# Patient Record
Sex: Female | Born: 1935 | Race: White | Hispanic: No | Marital: Married | Smoking: Never smoker
Health system: Southern US, Community
[De-identification: ages and names within clinical notes are randomized; demographics above are authoritative.]

## PROBLEM LIST (undated history)

## (undated) DIAGNOSIS — H9190 Unspecified hearing loss, unspecified ear: Secondary | ICD-10-CM

## (undated) DIAGNOSIS — F419 Anxiety disorder, unspecified: Secondary | ICD-10-CM

## (undated) DIAGNOSIS — IMO0001 Reserved for inherently not codable concepts without codable children: Secondary | ICD-10-CM

## (undated) DIAGNOSIS — Z7901 Long term (current) use of anticoagulants: Secondary | ICD-10-CM

## (undated) HISTORY — PX: APPENDECTOMY: SHX54

---

## 2008-05-08 ENCOUNTER — Ambulatory Visit: Payer: Self-pay | Admitting: Cardiology

## 2015-06-02 ENCOUNTER — Emergency Department (HOSPITAL_COMMUNITY): Payer: Medicare Other

## 2015-06-02 ENCOUNTER — Emergency Department (HOSPITAL_COMMUNITY)
Admission: EM | Admit: 2015-06-02 | Discharge: 2015-06-02 | Disposition: A | Discharge status: Home / Self Care | Payer: Medicare Other | Attending: Emergency Medicine | Admitting: Emergency Medicine

## 2015-06-02 ENCOUNTER — Encounter (HOSPITAL_COMMUNITY): Payer: Self-pay

## 2015-06-02 DIAGNOSIS — F22 Delusional disorders: Secondary | ICD-10-CM | POA: Diagnosis not present

## 2015-06-02 DIAGNOSIS — Z046 Encounter for general psychiatric examination, requested by authority: Secondary | ICD-10-CM | POA: Diagnosis present

## 2015-06-02 DIAGNOSIS — F419 Anxiety disorder, unspecified: Secondary | ICD-10-CM | POA: Insufficient documentation

## 2015-06-02 DIAGNOSIS — Z79899 Other long term (current) drug therapy: Secondary | ICD-10-CM | POA: Diagnosis not present

## 2015-06-02 DIAGNOSIS — H9190 Unspecified hearing loss, unspecified ear: Secondary | ICD-10-CM | POA: Diagnosis not present

## 2015-06-02 DIAGNOSIS — F0391 Unspecified dementia with behavioral disturbance: Secondary | ICD-10-CM

## 2015-06-02 DIAGNOSIS — Z7901 Long term (current) use of anticoagulants: Secondary | ICD-10-CM | POA: Diagnosis not present

## 2015-06-02 HISTORY — DX: Unspecified hearing loss, unspecified ear: H91.90

## 2015-06-02 HISTORY — DX: Anxiety disorder, unspecified: F41.9

## 2015-06-02 HISTORY — DX: Long term (current) use of anticoagulants: Z79.01

## 2015-06-02 HISTORY — DX: Reserved for inherently not codable concepts without codable children: IMO0001

## 2015-06-02 LAB — COMPREHENSIVE METABOLIC PANEL
ALBUMIN: 4.4 g/dL (ref 3.5–5.0)
ALT: 13 U/L — AB (ref 14–54)
AST: 18 U/L (ref 15–41)
Alkaline Phosphatase: 60 U/L (ref 38–126)
Anion gap: 10 (ref 5–15)
BILIRUBIN TOTAL: 0.8 mg/dL (ref 0.3–1.2)
BUN: 32 mg/dL — AB (ref 6–20)
CHLORIDE: 104 mmol/L (ref 101–111)
CO2: 26 mmol/L (ref 22–32)
CREATININE: 0.98 mg/dL (ref 0.44–1.00)
Calcium: 9.4 mg/dL (ref 8.9–10.3)
GFR calc Af Amer: >60 mL/min (ref >60)
GFR, EST NON AFRICAN AMERICAN: 52 mL/min — AB (ref >60)
GLUCOSE: 105 mg/dL — AB (ref 65–99)
POTASSIUM: 4.2 mmol/L (ref 3.5–5.1)
Sodium: 140 mmol/L (ref 135–145)
TOTAL PROTEIN: 7.2 g/dL (ref 6.5–8.1)

## 2015-06-02 LAB — CBC WITH DIFFERENTIAL/PLATELET
Basophils Absolute: 0.1 10*3/uL (ref 0.0–0.1)
Basophils Relative: 1 % (ref 0–1)
Eosinophils Absolute: 0.6 10*3/uL (ref 0.0–0.7)
Eosinophils Relative: 6 % — ABNORMAL HIGH (ref 0–5)
HCT: 40.5 % (ref 36.0–46.0)
Hemoglobin: 13.1 g/dL (ref 12.0–15.0)
Lymphocytes Relative: 28 % (ref 12–46)
Lymphs Abs: 2.7 10*3/uL (ref 0.7–4.0)
MCH: 31.2 pg (ref 26.0–34.0)
MCHC: 32.3 g/dL (ref 30.0–36.0)
MCV: 96.4 fL (ref 78.0–100.0)
Monocytes Absolute: 0.8 10*3/uL (ref 0.1–1.0)
Monocytes Relative: 9 % (ref 3–12)
Neutro Abs: 5.3 10*3/uL (ref 1.7–7.7)
Neutrophils Relative %: 56 % (ref 43–77)
Platelets: 227 10*3/uL (ref 150–400)
RBC: 4.2 MIL/uL (ref 3.87–5.11)
RDW: 12.9 % (ref 11.5–15.5)
WBC: 9.4 10*3/uL (ref 4.0–10.5)

## 2015-06-02 LAB — PROTIME-INR
INR: 1.17 (ref 0.00–1.49)
Prothrombin Time: 15.1 seconds (ref 11.6–15.2)

## 2015-06-02 LAB — ETHANOL: Alcohol, Ethyl (B): <5 mg/dL (ref <5)

## 2015-06-02 NOTE — ED Notes (Signed)
TTS consult complete  

## 2015-06-02 NOTE — BH Assessment (Signed)
Reviewed ED notes prior to initiating assessment. Pt was brought under IVC, petitioned by DSS due to concerns that pt's mental health has been untreated, and has resulted in her neglecting her physical health. DSS has an open case but pt refuses to open the door.    Requested cart be placed with pt for assessment and IVC papers be faxed to [phone].    Assessment to commence shortly.     Clista Bernhardt, [facility] Triage Specialist 06/02/2015 7:42 PM

## 2015-06-02 NOTE — ED Notes (Signed)
MD at bedside. 

## 2015-06-02 NOTE — ED Notes (Addendum)
Patients son at bedside talking with patient. Patient appears tearful at this time.

## 2015-06-02 NOTE — BH Assessment (Addendum)
Pt is unable to hear this writer on monitor and would be unable to hear on the phone as well. Collaborated with RN. Faxed copy of assessment sheet. RN will call this writer at [phone] when she has received assessment. Will attempt assessment with aid of RN.   Pt will need to verbalize consent to family remaining in room or they will need to step out.   Assessment to commence shortly.   Received call from Angelina at [facility] ED stating pt was being sent to CT. Requests RN call back when ready to begin assessment.   Clista Bernhardt, [facility] Triage Specialist 06/02/2015 8:00 PM

## 2015-06-02 NOTE — ED Notes (Signed)
Patient undress and belongings placed in locker with lock in place.

## 2015-06-02 NOTE — BH Assessment (Addendum)
Tele Assessment Note Pt is very hard of hearing, assessment was assisted by RN who would write questions to pt and speak directly in pt's ear. Collateral information was provided by pt's son, with pt's verbal consent.   Jeanne Ayers is an 81 y.o. female. BIB sheriff under IVC. Son had become concerned that pt's dementia and associated delusions were progressing, that she was not paying some bills that needed to be paid, and being very anxious about paying bills that did not need to be paid. He also reports concerns that pt is not maintaining appropriate hygiene around food preparation. At the time of assessment pt was calm and oriented times three, but did not understand why she had been brought to the hospital. Pt lives alone, uses no assistive devices, and does all of her ADLs without assistance. Pt denies SI, HI, AVH, self harm and SA. Pt reports her sons live near by and help her due errands. She states she likes to live alone and doing work around the house.   Pt denies sx of depression, mania or hypomania. She reports at times she gets upset at people, and family reports pt has panic attacks at times. Pt denies hx of abuse or neglect. Pt reports sometimes worry makes it hard for her to sleep if she is ruminating on something. Pt reports she puts a chair under the door and keeps it locked. She mentioned multiple times that she has been told people may try to kill her downtown. She reports she does have a gun in the home but denies that it works.   Family hx is negative for MH, SA or SI concerns.   Son reports he reached out to DSS with concerns at the advice of attorney. Son felt he may need to get POA, but attorney advised guardianship would be more appropriate. Per son attorney suggested pt contact DSS so he would not have to directly confront his mother about the guardianship. During assessment pt was effusive in her praise for her sons and how they help her.   Per IVC:   When asked directly  son reported he did not think pt was a danger to herself or others, and that she is still capable of living indpendently with some assistance. Pt has been treated by family doctor, and as far as son knows, no referral has been made to neurologist or psychiatrist.   Axis I: Dementia Per History  300.00 Unspecified Anxiety Disorder, rule out Anxiety related to another medical condition   Past Medical History:  Past Medical History  Diagnosis Date  . Anxiety   . Hearing impaired   . Chronic anticoagulation     Past Surgical History  Procedure Laterality Date  . Appendectomy      Family History: No family history on file.  Social History:  reports that she has never smoked. She does not have any smokeless tobacco history on file. She reports that she does not drink alcohol or use illicit drugs.  Additional Social History:  Alcohol / Drug Use Pain Medications: See PTA Prescriptions: See PTA, son reports pt takes half of prescribed medication for her heart issue Over the Counter: See PTA History of alcohol / drug use?: No history of alcohol / drug abuse Longest period of sobriety (when/how long): NA Negative Consequences of Use:  (NA) Withdrawal Symptoms:  (NA)  CIWA: CIWA-Ar BP: 165/74 mmHg Pulse Rate: 87 COWS:    PATIENT STRENGTHS: (choose at least two) Ability for insight Average or  above average intelligence Capable of independent living Communication skills  Allergies:  Allergies  Allergen Reactions  . Other     Pt unsure    Home Medications:  (Not in a hospital admission)  OB/GYN Status:  No LMP recorded.  General Assessment Data Location of Assessment: [facility] TTS Assessment: In system Is this a Tele or Face-to-Face Assessment?: Tele Assessment Is this an Initial Assessment or a Re-assessment for this encounter?: Initial Assessment Marital status: Widowed Is patient pregnant?: No Pregnancy Status: No Living Arrangements: Alone Can pt return to current  living arrangement?: Yes Admission Status: Voluntary Is patient capable of signing voluntary admission?: Yes Referral Source: Self/Family/Friend Insurance type: none     Crisis Care Plan Living Arrangements: Alone Name of Psychiatrist: none Name of Therapist: none  Education Status Is patient currently in school?: No Current Grade: NA Highest grade of school patient has completed: NA Name of school: NA Contact person: NA  Risk to self with the past 6 months Suicidal Ideation: No Has patient been a risk to self within the past 6 months prior to admission? : No Suicidal Intent: No Has patient had any suicidal intent within the past 6 months prior to admission? : No Is patient at risk for suicide?: No Suicidal Plan?: No Has patient had any suicidal plan within the past 6 months prior to admission? : No Access to Means: No What has been your use of drugs/alcohol within the last 12 months?: none Previous Attempts/Gestures: No How many times?: 0 Other Self Harm Risks: none Triggers for Past Attempts: None known Intentional Self Injurious Behavior: None Family Suicide History: No Recent stressful life event(s):  (none reported ) Persecutory voices/beliefs?: No Depression: No Depression Symptoms:  (irritable at times) Substance abuse history and/or treatment for substance abuse?: No Suicide prevention information given to non-admitted patients: Not applicable  Risk to Others within the past 6 months Homicidal Ideation: No Does patient have any lifetime risk of violence toward others beyond the six months prior to admission? : No Thoughts of Harm to Others: No Current Homicidal Intent: No Current Homicidal Plan: No Access to Homicidal Means: No Identified Victim: none History of harm to others?: No Assessment of Violence: None Noted Violent Behavior Description: none Does patient have access to weapons?: No Criminal Charges Pending?: No Does patient have a court date:  No Is patient on probation?: No  Psychosis Hallucinations: None noted Delusions: None noted  Mental Status Report Appearance/Hygiene: Unremarkable Eye Contact: Good Motor Activity: Unremarkable Speech: Logical/coherent Level of Consciousness: Alert Mood: Pleasant, Euthymic Affect: Appropriate to circumstance Anxiety Level: Moderate Thought Processes: Coherent, Relevant Judgement: Partial Orientation: Person, Place, Time, Situation Obsessive Compulsive Thoughts/Behaviors: None  Cognitive Functioning Concentration: Normal Memory: Remote Intact, Recent Impaired IQ: Average Insight: Fair Impulse Control: Fair Appetite: Good Weight Loss: 0 Weight Gain: 0 Sleep: No Change Total Hours of Sleep: 7 Vegetative Symptoms: None  ADLScreening [facility] Assessment Services) Patient's cognitive ability adequate to safely complete daily activities?: Yes Patient able to express need for assistance with ADLs?: Yes Independently performs ADLs?: Yes (appropriate for developmental age)  Prior Inpatient Therapy Prior Inpatient Therapy: No Prior Therapy Dates: NA Prior Therapy Facilty/Provider(s): NA Reason for Treatment: NA  Prior Outpatient Therapy Prior Outpatient Therapy: No Prior Therapy Dates: nA Prior Therapy Facilty/Provider(s): NA Reason for Treatment: NA Does patient have an ACCT team?: No Does patient have Intensive In-House Services?  : No Does patient have [facility] services? : No Does patient have P4CC services?: No  ADL  Screening (condition at time of admission) Patient's cognitive ability adequate to safely complete daily activities?: Yes Is the patient deaf or have difficulty hearing?: Yes Does the patient have difficulty seeing, even when wearing glasses/contacts?: No Does the patient have difficulty concentrating, remembering, or making decisions?: Yes Patient able to express need for assistance with ADLs?: Yes Does the patient have difficulty dressing or bathing?:  No Independently performs ADLs?: Yes (appropriate for developmental age) Weakness of Legs: None Weakness of Arms/Hands: None  Home Assistive Devices/Equipment Home Assistive Devices/Equipment: None    Abuse/Neglect Assessment (Assessment to be complete while patient is alone) Physical Abuse: Denies Verbal Abuse: Denies Sexual Abuse: Denies Exploitation of patient/patient's resources: Denies Self-Neglect: Denies Values / Beliefs Cultural Requests During Hospitalization: None Spiritual Requests During Hospitalization: None   Advance Directives (For Healthcare) Does patient have an advance directive?: No Would patient like information on creating an advanced directive?: No - patient declined information Nutrition Screen- [facility] Adult/[facility] Patient's home diet: Regular Has the patient recently lost weight without trying?: Yes, 2-13 lbs. (due to working in her yard) Has the patient been eating poorly because of a decreased appetite?: No Malnutrition Screening Tool Score: 1  Additional Information 1:1 In Past 12 Months?: No CIRT Risk: No Elopement Risk: No Does patient have medical clearance?: No     Disposition:  Per Janann August, FNP pt does not meet inpt criteria. Per Brett Albino, FNP a referral for a neurologist should be considered, and follow up with PCP as to why pt feels she only needs half the dose of her medications.   Informed Dr. Adela Lank of recommendations.      Clista Bernhardt, [facility] Triage Specialist 06/02/2015 9:07 PM  Disposition Initial Assessment Completed for this Encounter: Yes  Lanya Bucks M 06/02/2015 9:06 PM

## 2015-06-02 NOTE — ED Notes (Signed)
Patient wanded by security. [employer] at beside with patient.

## 2015-06-02 NOTE — ED Notes (Signed)
Pt brought to ER with [employer] with IVC paperwork.  Reports DSS took out the paperwork.  Papers state that pt has long history of untreated mental illness and she has been neglecting her physical health care due to delusional thoughts.  Reports has not been taking her medications.  Reports pt's windows are lined with aluminum foil, doors blocked by tree branches, and chairs, and she is barricading herself in her house.  Reports adult protective services has an open evaluation but says pt refuses to come to the door when they come.  IVC paperwork outlines more details, see paperwork.

## 2015-06-02 NOTE — Discharge Instructions (Signed)
Dementia °Dementia is a word that is used to describe problems with the brain and how it works. People with dementia have memory loss. They may also have problems with thinking, speaking, or solving problems. It can affect how they act around people, how they do their job, their mood, and their personality. These changes may not show up for a long time. Family or friends may not notice problems in the early part of this disease. °HOME CARE °The following tips are for the person living with, or caring for, the person with dementia. °Make the home safe. °· Remove locks on bathroom doors. °· Use childproof locks on cabinets where alcohol, cleaning supplies, or chemicals are stored. °· Put outlet covers in electrical outlets. °· Put in childproof locks to keep doors and windows safe. °· Remove stove knobs, or put in safety knobs that shut off on their own. °· Lower the temperature on water heaters. °· Label medicines. Lock them in a safe place. °· Keep knives, lighters, matches, power tools, and guns out of reach or in a safe place. °· Remove objects that might break or can hurt the person. °· Make sure lighting is good inside and outside. °· Put in grab bars if needed. °· Use a device that detects falls or other needs for help. °Lessen confusion. °· Keep familiar objects and people around. °· Use night lights or low lit (dim) lights at night. °· Label objects or areas. °· Use reminders, notes, or directions for daily activities or tasks. °· Keep a simple routine that is the same for waking, meals, bathing, dressing, and bedtime. °· Create a calm and quiet home. °· Put up clocks and calendars. °· Keep emergency numbers and the home address near all phones. °· Help show the different times of day. Open the curtains during the day to let light in. °Speak clearly and directly. °· Choose simple words and short sentences. °· Use a gentle, calm voice. °· Do not interrupt. °· If the person has a hard time finding a word to  use, give them the word or thought. °· Ask 1 question at a time. Give enough time for the person to answer. Repeat the question if the person does not answer. °Do things that lessen restlessness. °· Provide a comfortable bed. °· Have the same bedtime routine every night. °· Have a regular walking and activity schedule. °· Lessen naps during the day. °· Do not let the person drink a lot of caffeine. °· Go to events that are not overwhelming. °Eat well and drink fluids. °· Lessen distractions during meal times and snacks. °· Avoid foods that are too hot or too cold. °· Watch how the person chews and swallows. This is to make sure they do not choke. °Other °· Keep all vision, hearing, dental, and medical visits with the doctor. °· Only give medicines as told by the doctor. °· Watch the person's driving ability. Do not let the person drive if he or she cannot drive safely. °· Use a program that helps find a person if they become missing. You may need to register with this program. °GET HELP RIGHT AWAY IF:  °· A fever of 102° F (38.9° C) develops. °· Confusion develops or gets worse. °· Sleepiness develops or gets worse. °· Staying awake is hard to do. °· New behavior problems start like mood swings, aggression, and seeing things that are not there. °· Problems with balance, speech, or falling develop. °· Problems swallowing develop. °· Any   problems of another sickness develop. °MAKE SURE YOU: °· Understand these instructions. °· Will watch his or her condition. °· Will get help right away if he or she is not doing well or gets worse. °Document Released: 09/14/2008 Document Revised: 12/25/2011 Document Reviewed: 02/27/2011 °ExitCare® Patient Information ©2015 [employer]. This information is not intended to replace advice given to you by your health care provider. Make sure you discuss any questions you have with your health care provider. ° °

## 2015-06-02 NOTE — ED Provider Notes (Signed)
CSN: [id number]     Arrival date & time 06/02/15  1834 History   First MD Initiated Contact with Patient 06/02/15 [phone]     Chief Complaint  Patient presents with  . V70.1     (Consider location/radiation/quality/duration/timing/severity/associated sxs/prior Treatment) Patient is a 80 y.o. female presenting with mental health disorder. The history is provided by the patient, the EMS personnel and a significant other.  Mental Health Problem Presenting symptoms: agitation, delusional and paranoid behavior   Patient accompanied by:  Law enforcement Degree of incapacity (severity):  Severe Onset quality:  Gradual Duration:  12 months Timing:  Constant Progression:  Worsening Chronicity:  Chronic Context: noncompliance   Treatment compliance:  Untreated Time since last psychoactive medication taken:  2 days Relieved by:  Nothing Worsened by:  Nothing tried Ineffective treatments:  None tried Associated symptoms: no chest pain and no headaches   Risk factors: neurological disease (dementia)   Risk factors: no hx of mental illness    81 yo F with a chief complaint of delusions and dementia. Per the son the patient has been staying at home and not interacting with other people. Has been barricading the windows and doors with tree branches chairs and aluminum foil. After discussion with her family doctor the decision was made to involuntarily commit her. On arrival patient denies any such complaints. States that she just wants home. History difficult secondary to hearing loss.  Past Medical History  Diagnosis Date  . Anxiety   . Hearing impaired   . Chronic anticoagulation    Past Surgical History  Procedure Laterality Date  . Appendectomy     No family history on file. Social History  Substance Use Topics  . Smoking status: Never Smoker   . Smokeless tobacco: None  . Alcohol Use: No   OB History    No data available     Review of Systems  Constitutional: Negative for  fever and chills.  HENT: Negative for congestion and rhinorrhea.   Eyes: Negative for redness and visual disturbance.  Respiratory: Negative for shortness of breath and wheezing.   Cardiovascular: Negative for chest pain and palpitations.  Gastrointestinal: Negative for nausea and vomiting.  Genitourinary: Negative for dysuria and urgency.  Musculoskeletal: Negative for myalgias and arthralgias.  Skin: Negative for pallor and wound.  Neurological: Negative for dizziness and headaches.  Psychiatric/Behavioral: Positive for behavioral problems, paranoia and agitation.      Allergies  Other  Home Medications   Prior to Admission medications   Medication Sig Start Date End Date Taking? Authorizing Provider  atenolol (TENORMIN) 50 MG tablet Take 50 mg by mouth every morning. 05/28/15   Historical Provider, MD  LORazepam (ATIVAN) 0.5 MG tablet Take 1 tablet by mouth 2 (two) times daily. 05/24/15   Historical Provider, MD   BP 144/56 mmHg  Pulse 80  Temp(Src) 97.9 F (36.6 C) (Oral)  Resp 20  Ht 5' 3.5" (1.613 m)  Wt 106 lb 6.4 oz (48.263 kg)  BMI 18.55 kg/m2  SpO2 99% Physical Exam  Constitutional: She is oriented to person, place, and time. She appears well-developed and well-nourished. No distress.  HENT:  Head: Normocephalic and atraumatic.  Eyes: EOM are normal. Pupils are equal, round, and reactive to light.  Neck: Normal range of motion. Neck supple.  Cardiovascular: Normal rate and regular rhythm.  Exam reveals no gallop and no friction rub.   No murmur heard. Pulmonary/Chest: Effort normal. She has no wheezes. She has no rales.  Abdominal:  Soft. She exhibits no distension. There is no tenderness. There is no rebound and no guarding.  Musculoskeletal: She exhibits no edema or tenderness.  Neurological: She is alert and oriented to person, place, and time.  Skin: Skin is warm and dry. She is not diaphoretic.  Psychiatric: She has a normal mood and affect. Her behavior is  normal. Thought content is delusional.    ED Course  Procedures (including critical care time) Labs Review Labs Reviewed  CBC WITH DIFFERENTIAL/PLATELET - Abnormal; Notable for the following:    Eosinophils Relative 6 (*)    All other components within normal limits  COMPREHENSIVE METABOLIC PANEL - Abnormal; Notable for the following:    Glucose, Bld 105 (*)    BUN 32 (*)    ALT 13 (*)    GFR calc non Af Amer 52 (*)    All other components within normal limits  ETHANOL  PROTIME-INR  URINE RAPID DRUG SCREEN, HOSP PERFORMED  URINALYSIS, ROUTINE W REFLEX MICROSCOPIC (NOT AT [facility])    Imaging Review Ct Head Wo Contrast  06/02/2015   CLINICAL DATA:  Altered mental status.  EXAM: CT HEAD WITHOUT CONTRAST  TECHNIQUE: Contiguous axial images were obtained from the base of the skull through the vertex without intravenous contrast.  COMPARISON:  None.  FINDINGS: There is a 7 mm calcification on the inner table of the right frontal bone which probably represents a small calcified meningioma. This is of no consequence. There is no acute intracranial hemorrhage or acute infarction.  There are multiple areas of periventricular white matter lucency consistent with chronic small vessel ischemic disease. Diffuse cerebral cortical and cerebellar atrophy. No significant ventricular dilatation.  Chronic sinus disease.  Previous bilateral mastoid surgery.  IMPRESSION: No acute intracranial abnormality. 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 Diffuse atrophy.   Electronically Signed   By: Francene Boyers M.D.   On: 06/02/2015 20:39   I have personally reviewed and evaluated these images and lab results as part of my medical decision-making.   EKG Interpretation None      MDM   Final diagnoses:  Delusion  Dementia, with behavioral disturbance    81 yo F with a chief Plane of delusions and worsening dementia. Patient was evaluated medically without any acute  medical issues. CBC CMP EtOH and PT/INR unremarkable. CT head unremarkable. Patient unable to provide urine. Evaluated by TTS, patient was found to not qualify for inpatient criteria. Feel that the patient needs a full neurologic evaluation by a neurologist prior to mental health admission. Discussed with patient and family feel comfortable with discharge home. Will have PCP follow-up tomorrow.  9:48 PM:  I have discussed the diagnosis/risks/treatment options with the patient and family and believe the pt to be eligible for discharge home to follow-up with PCP. We also discussed returning to the ED immediately if new or worsening sx occur. We discussed the sx which are most concerning (e.g., sudden worsening complaints) that necessitate immediate return. Medications administered to the patient during their visit and any new prescriptions provided to the patient are listed below.  Medications given during this visit Medications - No data to display  New Prescriptions   No medications on file     The patient appears reasonably screen and/or stabilized for discharge and I doubt any other medical condition or other [facility] requiring further screening, evaluation, or treatment in the ED at this time prior to discharge.      Melene Plan, DO  06/02/15 2148 

## 2019-07-14 ENCOUNTER — Emergency Department (HOSPITAL_COMMUNITY)
Admission: EM | Admit: 2019-07-14 | Discharge: 2019-07-14 | Disposition: A | Discharge status: Home / Self Care | Payer: Medicare Other | Attending: Emergency Medicine | Admitting: Emergency Medicine

## 2019-07-14 ENCOUNTER — Encounter (HOSPITAL_COMMUNITY): Payer: Self-pay | Admitting: Emergency Medicine

## 2019-07-14 ENCOUNTER — Emergency Department (HOSPITAL_COMMUNITY): Payer: Medicare Other

## 2019-07-14 ENCOUNTER — Other Ambulatory Visit: Payer: Self-pay

## 2019-07-14 DIAGNOSIS — S0181XA Laceration without foreign body of other part of head, initial encounter: Secondary | ICD-10-CM | POA: Insufficient documentation

## 2019-07-14 DIAGNOSIS — W19XXXA Unspecified fall, initial encounter: Secondary | ICD-10-CM | POA: Diagnosis not present

## 2019-07-14 DIAGNOSIS — Z23 Encounter for immunization: Secondary | ICD-10-CM | POA: Diagnosis not present

## 2019-07-14 DIAGNOSIS — M25512 Pain in left shoulder: Secondary | ICD-10-CM | POA: Insufficient documentation

## 2019-07-14 DIAGNOSIS — Y939 Activity, unspecified: Secondary | ICD-10-CM | POA: Insufficient documentation

## 2019-07-14 DIAGNOSIS — Y92129 Unspecified place in nursing home as the place of occurrence of the external cause: Secondary | ICD-10-CM | POA: Diagnosis not present

## 2019-07-14 DIAGNOSIS — Y999 Unspecified external cause status: Secondary | ICD-10-CM | POA: Insufficient documentation

## 2019-07-14 DIAGNOSIS — Z79899 Other long term (current) drug therapy: Secondary | ICD-10-CM | POA: Diagnosis not present

## 2019-07-14 DIAGNOSIS — S0990XA Unspecified injury of head, initial encounter: Secondary | ICD-10-CM | POA: Diagnosis present

## 2019-07-14 DIAGNOSIS — S42202A Unspecified fracture of upper end of left humerus, initial encounter for closed fracture: Secondary | ICD-10-CM

## 2019-07-14 MED ORDER — ACETAMINOPHEN 325 MG PO TABS
650.0000 mg | ORAL_TABLET | Freq: Once | ORAL | Status: AC
  Administered 2019-07-14: 650 mg via ORAL
  Filled 2019-07-14: qty 2

## 2019-07-14 MED ORDER — LIDOCAINE-EPINEPHRINE (PF) 2 %-1:200000 IJ SOLN
10.0000 mL | Freq: Once | INTRAMUSCULAR | Status: AC
  Administered 2019-07-14: 10 mL
  Filled 2019-07-14: qty 10

## 2019-07-14 MED ORDER — TETANUS-DIPHTH-ACELL PERTUSSIS 5-2.5-18.5 LF-MCG/0.5 IM SUSP
0.5000 mL | Freq: Once | INTRAMUSCULAR | Status: AC
  Administered 2019-07-14: 0.5 mL via INTRAMUSCULAR
  Filled 2019-07-14: qty 0.5

## 2019-07-14 MED ORDER — TRAMADOL HCL 50 MG PO TABS
25.0000 mg | ORAL_TABLET | Freq: Once | ORAL | Status: AC
  Administered 2019-07-14: 07:00:00 25 mg via ORAL
  Filled 2019-07-14: qty 1

## 2019-07-14 NOTE — ED Provider Notes (Signed)
[facility] EMERGENCY DEPARTMENT Provider Note   CSN: [id number] Arrival date & time: 07/14/19  0256     History   Chief Complaint Chief Complaint  Patient presents with   Fall    HPI Jeanne Ayers is a 85 y.o. female.     Patient brought to the ER for evaluation after a fall.  She comes to the ER by ambulance from nursing home.  Fall was unwitnessed, patient found on the floor.  She has a laceration on the left side of her forehead and complains of left shoulder pain.     Past Medical History:  Diagnosis Date   Anxiety    Chronic anticoagulation    Hearing impaired     There are no active problems to display for this patient.   Past Surgical History:  Procedure Laterality Date   APPENDECTOMY       OB History   No obstetric history on file.      Home Medications    Prior to Admission medications   Medication Sig Start Date End Date Taking? Authorizing Provider  atenolol (TENORMIN) 50 MG tablet Take 50 mg by mouth every morning. 05/28/15   [provider]  LORazepam (ATIVAN) 0.5 MG tablet Take 1 tablet by mouth 2 (two) times daily. 05/24/15   [provider]    Family History History reviewed. No pertinent family history.  Social History Social History   Tobacco Use   Smoking status: Never Smoker  Substance Use Topics   Alcohol use: No   Drug use: No     Allergies   Other, Penicillins, and Sulfa antibiotics   Review of Systems Review of Systems  Musculoskeletal: Positive for arthralgias.  Skin: Positive for wound.  All other systems reviewed and are negative.    Physical Exam Updated Vital Signs Ht 5\' 5"  (1.651 m)    Wt 54.4 kg    BMI 19.97 kg/m   Physical Exam Vitals signs and nursing note reviewed.  Constitutional:      General: She is not in acute distress.    Appearance: Normal appearance. She is well-developed.  HENT:     Head: Normocephalic. Laceration (left forehead) present.      Right Ear:  Hearing normal.     Left Ear: Hearing normal.     Nose: Nose normal.  Eyes:     Extraocular Movements: Extraocular movements intact.     Conjunctiva/sclera: Conjunctivae normal.     Left eye: No hemorrhage.    Pupils: Pupils are equal, round, and reactive to light.  Neck:     Musculoskeletal: Normal range of motion and neck supple.  Cardiovascular:     Rate and Rhythm: Regular rhythm.     Heart sounds: S1 normal and S2 normal. No murmur. No friction rub. No gallop.   Pulmonary:     Effort: Pulmonary effort is normal. No respiratory distress.     Breath sounds: Normal breath sounds.  Chest:     Chest wall: No tenderness.  Abdominal:     General: Bowel sounds are normal.     Palpations: Abdomen is soft.     Tenderness: There is no abdominal tenderness. There is no guarding or rebound. Negative signs include Murphy's sign and McBurney's sign.     Hernia: No hernia is present.  Musculoskeletal:     Left shoulder: She exhibits decreased range of motion and tenderness.     Right hip: Normal.     Left hip: Normal.  Skin:  General: Skin is warm and dry.     Findings: Ecchymosis and laceration present. No rash.  Neurological:     Mental Status: She is alert and oriented to person, place, and time.     GCS: GCS eye subscore is 4. GCS verbal subscore is 5. GCS motor subscore is 6.     Cranial Nerves: No cranial nerve deficit.     Sensory: No sensory deficit.     Coordination: Coordination normal.  Psychiatric:        Speech: Speech normal.        Behavior: Behavior normal.        Thought Content: Thought content normal.      ED Treatments / Results  Labs (all labs ordered are listed, but only abnormal results are displayed) Labs Reviewed - No data to display  EKG None  Radiology Dg Chest 1 View  Result Date: 07/14/2019 CLINICAL DATA:  Fall EXAM: CHEST  1 VIEW COMPARISON:  None available FINDINGS: Mild streaky density behind the heart, likely atelectasis or scarring. No  edema, effusion, or pneumothorax. Normal heart size allowing for rotation. Known left humeral neck fracture. IMPRESSION: No evidence of cardiopulmonary disease. Electronically Signed   By: Marnee Spring M.D.   On: 07/14/2019 04:06   Dg Pelvis 1-2 Views  Result Date: 07/14/2019 CLINICAL DATA:  Fall EXAM: PELVIS - 1-2 VIEW COMPARISON:  None available FINDINGS: Basicervical left femoral neck fracture with chronic appearance - fracture margins are sclerotic and well-defined. There is mild varus angulation. Possible remote inferior pubic rami fractures. No evidence of acute fracture or diastasis. Osteopenia is advanced. IMPRESSION: Chronic appearing left femoral neck fracture with varus angulation. Electronically Signed   By: Marnee Spring M.D.   On: 07/14/2019 04:08   Ct Head Wo Contrast  Result Date: 07/14/2019 CLINICAL DATA:  Head trauma EXAM: CT HEAD WITHOUT CONTRAST CT CERVICAL SPINE WITHOUT CONTRAST TECHNIQUE: Multidetector CT imaging of the head and cervical spine was performed following the standard protocol without intravenous contrast. Multiplanar CT image reconstructions of the cervical spine were also generated. COMPARISON:  02/12/2016 report FINDINGS: CT HEAD FINDINGS Brain: No evidence of acute infarction, hemorrhage, hydrocephalus, extra-axial collection or mass lesion/mass effect. Patchy low-density in the cerebral white matter from chronic small vessel ischemia, moderate to extensive. Nonspecific atrophy. Small dural-based calcification along the right frontal convexity is stable from 2017 and incidental. Vascular: No hyperdense vessel or unexpected calcification. Skull: Negative for fracture Sinuses/Orbits: Bilateral mastoidectomy with opacification of the right more than left mastoid bowl. Deficient bilateral ossicular chain, worse on the right, likely stable. CT CERVICAL SPINE FINDINGS Alignment: Normal. Skull base and vertebrae: No acute fracture. No primary bone lesion or focal  pathologic process. Soft tissues and spinal canal: No prevertebral fluid or swelling. No visible canal hematoma. Disc levels:  Ordinary degenerative changes Upper chest: Negative IMPRESSION: 1. No evidence of intracranial or cervical spine injury. 2. Chronic findings are described above. Electronically Signed   By: Marnee Spring M.D.   On: 07/14/2019 04:15   Ct Cervical Spine Wo Contrast  Result Date: 07/14/2019 CLINICAL DATA:  Head trauma EXAM: CT HEAD WITHOUT CONTRAST CT CERVICAL SPINE WITHOUT CONTRAST TECHNIQUE: Multidetector CT imaging of the head and cervical spine was performed following the standard protocol without intravenous contrast. Multiplanar CT image reconstructions of the cervical spine were also generated. COMPARISON:  02/12/2016 report FINDINGS: CT HEAD FINDINGS Brain: No evidence of acute infarction, hemorrhage, hydrocephalus, extra-axial collection or mass lesion/mass effect. Patchy low-density in the  cerebral white matter from chronic small vessel ischemia, moderate to extensive. Nonspecific atrophy. Small dural-based calcification along the right frontal convexity is stable from 2017 and incidental. Vascular: No hyperdense vessel or unexpected calcification. Skull: Negative for fracture Sinuses/Orbits: Bilateral mastoidectomy with opacification of the right more than left mastoid bowl. Deficient bilateral ossicular chain, worse on the right, likely stable. CT CERVICAL SPINE FINDINGS Alignment: Normal. Skull base and vertebrae: No acute fracture. No primary bone lesion or focal pathologic process. Soft tissues and spinal canal: No prevertebral fluid or swelling. No visible canal hematoma. Disc levels:  Ordinary degenerative changes Upper chest: Negative IMPRESSION: 1. No evidence of intracranial or cervical spine injury. 2. Chronic findings are described above. Electronically Signed   By: Marnee Spring M.D.   On: 07/14/2019 04:15   Dg Shoulder Left  Result Date: 07/14/2019 CLINICAL  DATA:  Left shoulder pain EXAM: LEFT SHOULDER - 2+ VIEW COMPARISON:  None. FINDINGS: Nondisplaced surgical neck fracture of the humerus with mild impaction medially and posteriorly. No dislocation. Prominent generalized osteopenia. IMPRESSION: Mildly impacted surgical neck fracture of the humerus. Electronically Signed   By: Marnee Spring M.D.   On: 07/14/2019 04:04    Procedures .Marland KitchenLaceration Repair  Date/Time: 07/14/2019 4:43 AM Performed by: Gilda Crease, MD Authorized by: Gilda Crease, MD   Consent:    Consent obtained:  Emergent situation Universal protocol:    Imaging studies available: yes     Required blood products, implants, devices, and special equipment available: yes     Site/side marked: yes     Immediately prior to procedure, a time out was called: yes     Patient identity confirmed:  Hospital-assigned identification number Anesthesia (see MAR for exact dosages):    Anesthesia method:  Local infiltration   Local anesthetic:  Lidocaine 2% WITH epi Laceration details:    Location:  Face   Face location:  Forehead   Length (cm):  2.3 Repair type:    Repair type:  Simple Pre-procedure details:    Preparation:  Patient was prepped and draped in usual sterile fashion and imaging obtained to evaluate for foreign bodies Exploration:    Hemostasis achieved with:  Epinephrine and direct pressure   Contaminated: no   Treatment:    Area cleansed with:  Hibiclens   Irrigation solution:  Sterile saline   Irrigation method:  Syringe Skin repair:    Repair method:  Sutures   Suture size:  5-0   Suture material:  Fast-absorbing gut   Number of sutures:  4 Approximation:    Approximation:  Close Post-procedure details:    Dressing:  Open (no dressing)   Patient tolerance of procedure:  Tolerated well, no immediate complications   (including critical care time)  Medications Ordered in ED Medications  lidocaine-EPINEPHrine (XYLOCAINE W/EPI) 2  %-1:200000 (PF) injection 10 mL (10 mLs Infiltration Given 07/14/19 0320)  Tdap (BOOSTRIX) injection 0.5 mL (0.5 mLs Intramuscular Given 07/14/19 0318)     Initial Impression / Assessment and Plan / ED Course  I have reviewed the triage vital signs and the nursing notes.  Pertinent labs & imaging results that were available during my care of the patient were reviewed by me and considered in my medical decision making (see chart for details).        Patient presents to the emergency department for evaluation after an unwitnessed fall.  Patient found on the ground complaining of left shoulder pain.  She was also noted to have a laceration  and contusion to the left forehead.  Patient underwent CT head and cervical spine, no acute abnormality noted.  Patient does have a proximal humerus fracture seen on x-ray.  This will be treated with analgesia and sling.  No thoracic or lumbar pain or tenderness.  Pelvic x-ray negative, patient able to move both lower extremities without difficulty no evidence of hip or other lower extremity injury.  Final Clinical Impressions(s) / ED Diagnoses   Final diagnoses:  Forehead laceration, initial encounter  Closed fracture of proximal end of left humerus, unspecified fracture morphology, initial encounter    ED Discharge Orders    None       Gilda CreasePollina, Donterius Filley J, MD 07/14/19 [phone]

## 2019-07-14 NOTE — ED Notes (Signed)
Attempted report to [facility]. No answer. D/C instructions reviewed with ems and written copy given.

## 2019-07-14 NOTE — ED Notes (Signed)
[facility] called to inform patient needs transported back to facility.

## 2019-07-14 NOTE — Discharge Instructions (Signed)
Sutures will dissolve.  Watch for infection.  Follow-up with orthopedics to ensure healing.  Sling at all times until follow-up.  Please have primary prescribe pain medication.

## 2019-07-14 NOTE — ED Triage Notes (Signed)
Pt from [facility] in [place]. Pt had unwitnessed fall, pt found on floor with approx 1in lac on left side forehead and c/o left shoulder pain.

## 2019-07-14 NOTE — ED Notes (Signed)
Pt resting comfortably in NAD. Awaiting transport to [facility].

## 2019-07-24 ENCOUNTER — Encounter: Payer: Self-pay | Admitting: Orthopaedic Surgery

## 2019-07-24 ENCOUNTER — Ambulatory Visit (INDEPENDENT_AMBULATORY_CARE_PROVIDER_SITE_OTHER): Payer: Medicare Other | Admitting: Orthopaedic Surgery

## 2019-07-24 ENCOUNTER — Ambulatory Visit: Payer: Medicare Other

## 2019-07-24 ENCOUNTER — Other Ambulatory Visit: Payer: Self-pay

## 2019-07-24 VITALS — BP 132/70 | HR 80 | Temp 97.7°F | Ht 62.0 in | Wt 130.0 lb

## 2019-07-24 DIAGNOSIS — S42295A Other nondisplaced fracture of upper end of left humerus, initial encounter for closed fracture: Secondary | ICD-10-CM

## 2019-07-24 NOTE — Progress Notes (Signed)
Subjective:    Patient ID: Jeanne Ayers, female    DOB: [birth year], 85 y.o.   MRN: [id number]  HPI She is a resident at [facility].  She is chronically confused.  She fell and hurt her left shoulder on 07-14-2019.  She was seen in the ER.  X-rays showed a fracture of the proximal humerus.  She was given a sling.  Her pain is controlled.  She has no new injury.  She is accompanied by someone from the nursing home.   Review of Systems  Constitutional: Positive for activity change.  Musculoskeletal: Positive for arthralgias and joint swelling.  Psychiatric/Behavioral: Positive for confusion.       Confused as to time, place, person.  Very pleasant but confused.  All other systems reviewed and are negative.  For Review of Systems, all other systems reviewed and are negative.  The following is a summary of the past history medically, past history surgically, known current medicines, social history and family history.  This information is gathered electronically by the computer from prior information and documentation.  I review this each visit and have found including this information at this point in the chart is beneficial and informative.   Past Medical History:  Diagnosis Date  . Anxiety   . Chronic anticoagulation   . Hearing impaired     Past Surgical History:  Procedure Laterality Date  . APPENDECTOMY      Current Outpatient Medications on File Prior to Visit  Medication Sig Dispense Refill  . atenolol (TENORMIN) 50 MG tablet Take 50 mg by mouth every morning.  0  . LORazepam (ATIVAN) 0.5 MG tablet Take 1 tablet by mouth 2 (two) times daily.  0   No current facility-administered medications on file prior to visit.     Social History   Socioeconomic History  . Marital status: Married    Spouse name: Not on file  . Number of children: Not on file  . Years of education: Not on file  . Highest education level: Not on file  Occupational History  . Not  on file  Social Needs  . Financial resource strain: Not on file  . Food insecurity    Worry: Not on file    Inability: Not on file  . Transportation needs    Medical: Not on file    Non-medical: Not on file  Tobacco Use  . Smoking status: Never Smoker  Substance and Sexual Activity  . Alcohol use: No  . Drug use: No  . Sexual activity: Not on file  Lifestyle  . Physical activity    Days per week: Not on file    Minutes per session: Not on file  . Stress: Not on file  Relationships  . Social Herbalist on phone: Not on file    Gets together: Not on file    Attends religious service: Not on file    Active member of club or organization: Not on file    Attends meetings of clubs or organizations: Not on file    Relationship status: Not on file  . Intimate partner violence    Fear of current or ex partner: Not on file    Emotionally abused: Not on file    Physically abused: Not on file    Forced sexual activity: Not on file  Other Topics Concern  . Not on file  Social History Narrative  . Not on file    History reviewed.  No pertinent family history.  BP 132/70   Pulse 80   Temp 97.7 F (36.5 C)   Ht 5\' 2"  (1.575 m)   Wt 130 lb (59 kg)   BMI 23.78 kg/m   Body mass index is 23.78 kg/m.     Objective:   Physical Exam Vitals signs reviewed.  Constitutional:      Appearance: She is well-developed.  HENT:     Head: Normocephalic and atraumatic.  Eyes:     Conjunctiva/sclera: Conjunctivae normal.     Pupils: Pupils are equal, round, and reactive to light.  Neck:     Musculoskeletal: Normal range of motion and neck supple.  Cardiovascular:     Rate and Rhythm: Normal rate and regular rhythm.  Pulmonary:     Effort: Pulmonary effort is normal.  Abdominal:     Palpations: Abdomen is soft.  Skin:    General: Skin is warm and dry.  Neurological:     Mental Status: She is alert. She is disoriented.     Cranial Nerves: No cranial nerve deficit.      Motor: No abnormal muscle tone.     Coordination: Coordination normal.     Deep Tendon Reflexes: Reflexes are normal and symmetric. Reflexes normal.  Psychiatric:        Mood and Affect: Mood normal.        Cognition and Memory: Cognition is impaired. She exhibits impaired recent memory.     Comments: Disoriented to place, person, time.     X-rays were done of the left shoulder, reported separately.       Assessment & Plan:   Encounter Diagnosis  Name Primary?  . Other closed nondisplaced fracture of proximal end of left humerus, initial encounter Yes   Continue the sling. Return in one month.  I have reviewed the prior x-rays and reports.  X-rays prior to next visit at the nursing home.  Forms completed for the nursing home.  Call if any problem.  Precautions discussed.   Electronically Signed , MD 10/8/202012:25 PM

## 2019-08-21 ENCOUNTER — Ambulatory Visit: Payer: Medicare Other | Admitting: Orthopaedic Surgery

## 2019-09-02 ENCOUNTER — Ambulatory Visit: Payer: Medicare Other | Admitting: Orthopaedic Surgery

## 2019-09-16 ENCOUNTER — Ambulatory Visit: Payer: Medicare Other | Admitting: Orthopaedic Surgery

## 2019-09-22 ENCOUNTER — Telehealth: Payer: Self-pay | Admitting: Orthopaedic Surgery

## 2019-09-22 NOTE — Telephone Encounter (Signed)
Per Collie Siad, patient's nurse at [facility] [phone]) - received patient's mobile-Xray report, in advance of scheduled appointment 09/23/19. Copy in Dr's box. Please review and advise regarding in-person appointment Vs virtual visit.

## 2019-09-23 ENCOUNTER — Other Ambulatory Visit: Payer: Self-pay

## 2019-09-23 ENCOUNTER — Encounter: Payer: Self-pay | Admitting: Orthopaedic Surgery

## 2019-09-23 ENCOUNTER — Ambulatory Visit (INDEPENDENT_AMBULATORY_CARE_PROVIDER_SITE_OTHER): Payer: Medicare Other | Admitting: Orthopaedic Surgery

## 2019-09-23 DIAGNOSIS — S42295A Other nondisplaced fracture of upper end of left humerus, initial encounter for closed fracture: Secondary | ICD-10-CM

## 2019-09-23 NOTE — Telephone Encounter (Signed)
I can do virtual and save her a trip here.

## 2019-09-23 NOTE — Telephone Encounter (Signed)
Called back to [facility], [place]; spoke with Collie Siad. Advised to call back to [address] to speak with clinical staff at [phone].

## 2019-09-23 NOTE — Progress Notes (Signed)
Virtual Visit via Telephone Note  I connected with@ on 09/23/19 at  2:10 PM EST by telephone and verified that I am speaking with the correct person using two identifiers.  Location: Patient: nursing home [facility] Provider: office, Linna Hoff   I discussed the limitations, risks, security and privacy concerns of performing an evaluation and management service by telephone and the availability of in person appointments. I also discussed with the patient that there may be a patient responsible charge related to this service. The patient expressed understanding and agreed to proceed.   History of Present Illness: She had new x-rays of the left shoulder at the nursing home with a reading of new acute on old fracture of the left surgical neck of the humerus.  I talked to her main nurse, Collie Siad today.  She says the patient is not complaining of pain and is using the arm some.  The patient refuses sling. She does not know if there has been any new injury.  The arm is not red or swollen.   Observations/Objective: Per above.  Assessment and Plan: Encounter Diagnosis  Name Primary?  . Other closed nondisplaced fracture of proximal end of left humerus, initial encounter Yes    Follow Up Instructions: Get x-rays in one month of the left shoulder.  Continue as they are doing. Call if worse.   I discussed the assessment and treatment plan with the patient. The patient was provided an opportunity to ask questions and all were answered. The patient agreed with the plan and demonstrated an understanding of the instructions.   The patient was advised to call back or seek an in-person evaluation if the symptoms worsen or if the condition fails to improve as anticipated.  I provided 8 minutes of non-face-to-face time during this encounter.   Sanjuana Kava, MD

## 2019-12-15 DEATH — deceased

## 2020-07-25 IMAGING — CT CT CERVICAL SPINE W/O CM
4 of 11 series · 9 of 33 positions shown, 10 images · non-contrast
Comparison: 02/12/2016 report

CLINICAL DATA: Head trauma

EXAM:
CT HEAD WITHOUT CONTRAST
CT CERVICAL SPINE WITHOUT CONTRAST
TECHNIQUE: Multidetector CT imaging of the head and cervical spine was
performed following the standard protocol without intravenous
contrast. Multiplanar CT image reconstructions of the cervical spine
were also generated.

[Series 9: c spine soft · axial · 0.43mm/px · z∈[+105,+197]mm · 3 of 92 slices shown (1 of 2)]
[im 23/92  soft-tissue]
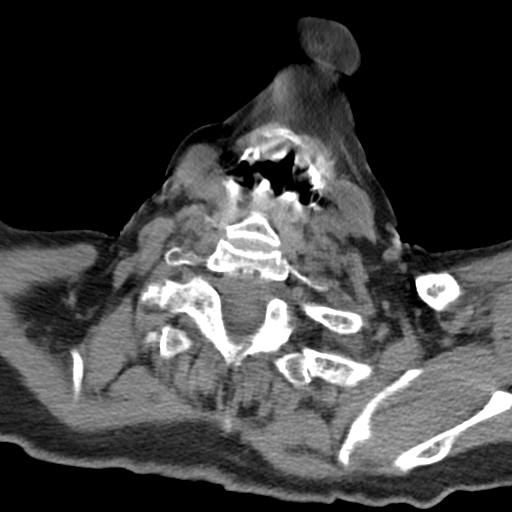
[im 46/92  soft-tissue]
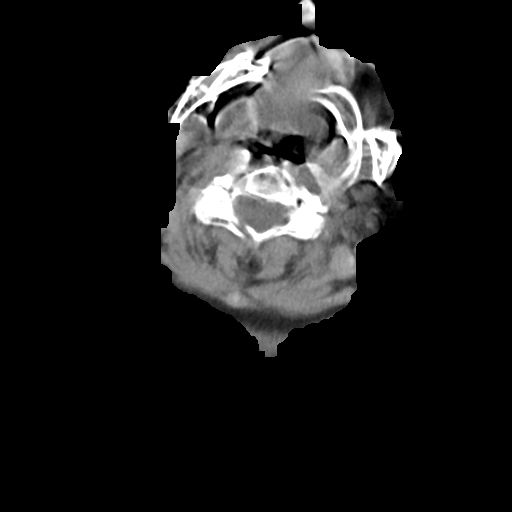
[im 69/92  soft-tissue]
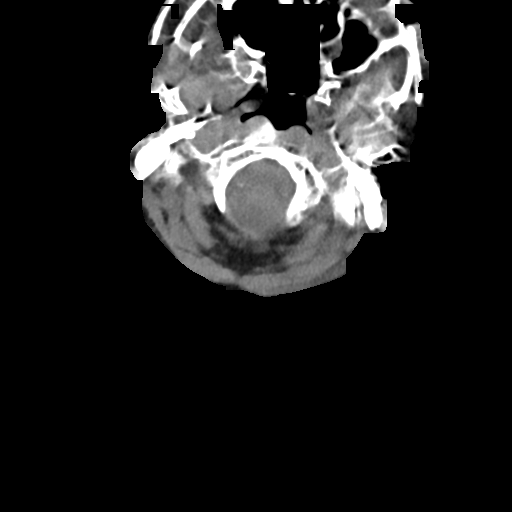

[Series 10: sagittal bone · sagittal · 0.27mm/px · 2 of 61 slices shown]
[im 21/61  bone]
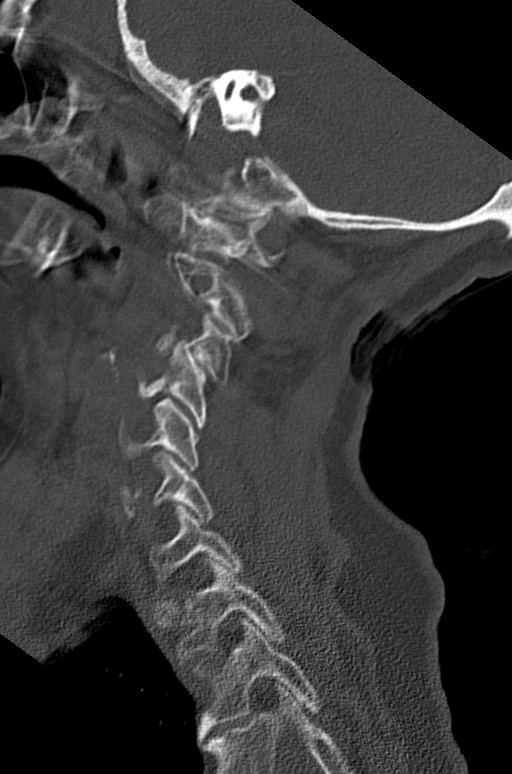
[im 41/61  bone]
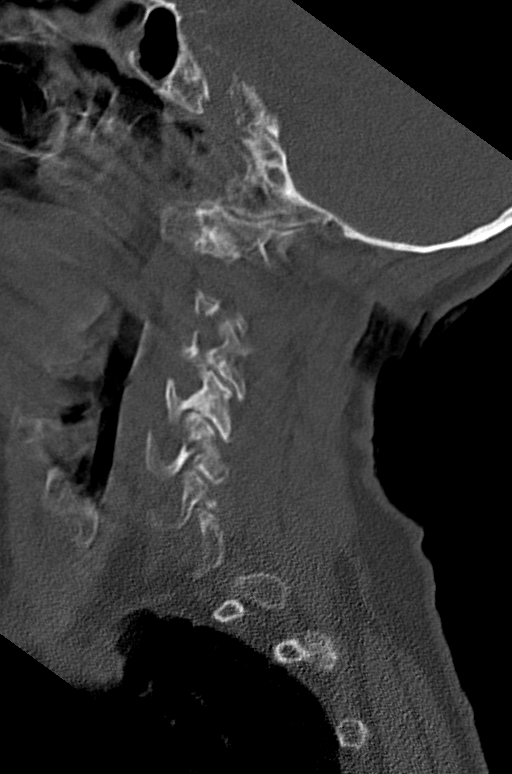

[Series 14: c spine soft · axial · 0.37mm/px · z∈[+129,+189]mm · 2 of 82 slices shown (2 of 2)]
[im 28/82  soft-tissue]
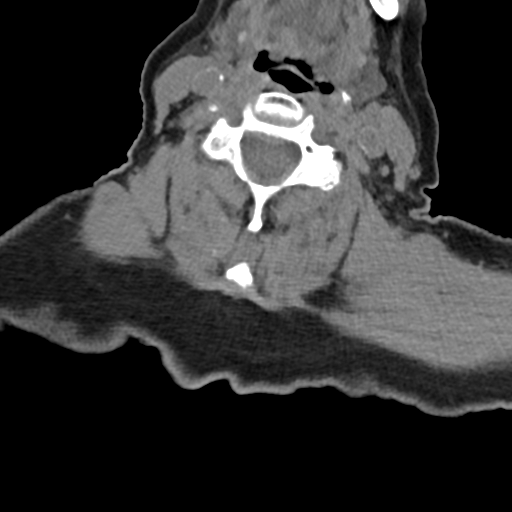
[im 55/82  soft-tissue]
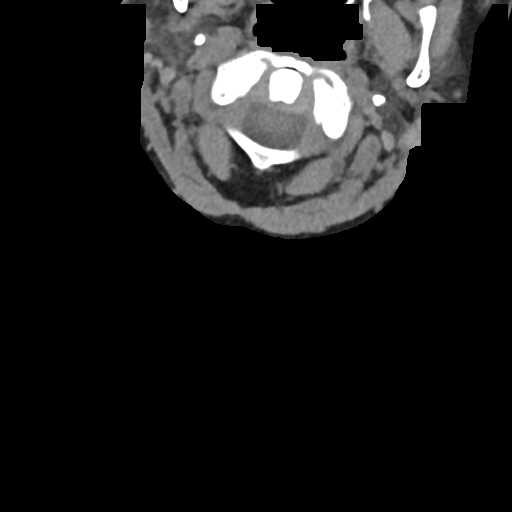

[Series 17: orthogonal axials · axial · 0.21mm/px · z∈[+108,+154]mm · 2 of 69 slices shown, 3 images]
[im 23/69  soft-tissue]
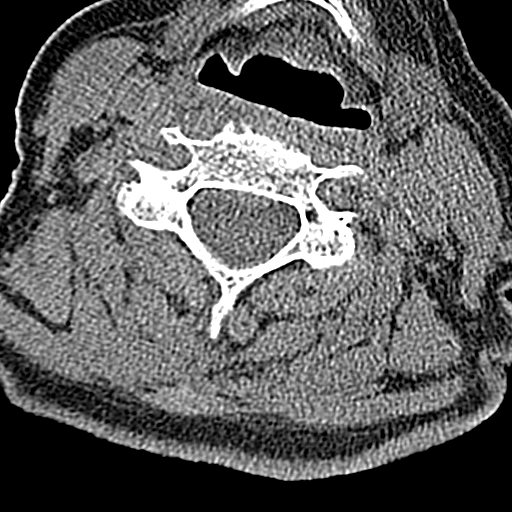
[im 23/69  bone]
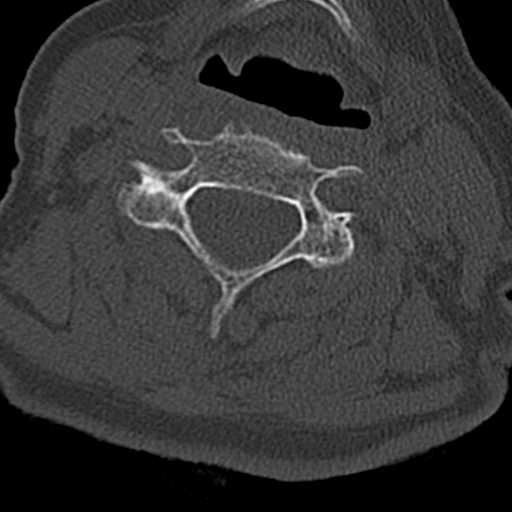
[im 46/69  bone]
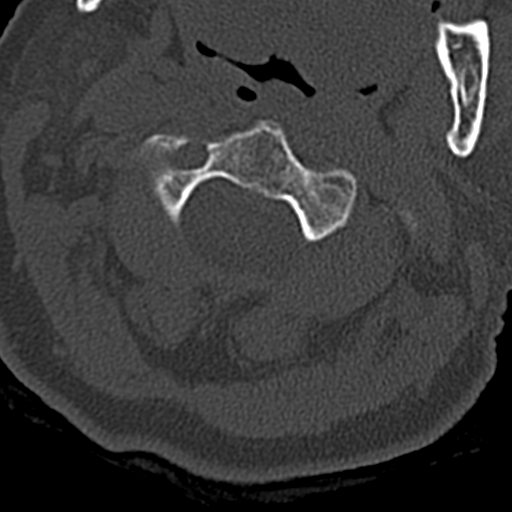

[9 of 33 positions shown; findings below may reference images not displayed]

FINDINGS: CT HEAD FINDINGS

Brain: No evidence of acute infarction, hemorrhage, hydrocephalus,
extra-axial collection or mass lesion/mass effect. Patchy
low-density in the cerebral white matter from chronic small vessel
ischemia, moderate to extensive. Nonspecific atrophy. Small
dural-based calcification along the right frontal convexity is
stable from 5425 and incidental.

Vascular: No hyperdense vessel or unexpected calcification.

Skull: Negative for fracture

Sinuses/Orbits: Bilateral mastoidectomy with opacification of the
right more than left mastoid bowl. Deficient bilateral ossicular
chain, worse on the right, likely stable.

CT CERVICAL SPINE FINDINGS

Alignment: Normal.

Skull base and vertebrae: No acute fracture. No primary bone lesion
or focal pathologic process.

Soft tissues and spinal canal: No prevertebral fluid or swelling. No
visible canal hematoma.

Disc levels:  Ordinary degenerative changes

Upper chest: Negative
IMPRESSION: 1. No evidence of intracranial or cervical spine injury.
2. Chronic findings are described above.

## 2020-07-25 IMAGING — CT CT HEAD W/O CM
5 of 11 series · 18 of 47 positions shown, 19 images · non-contrast
Comparison: 02/12/2016 report

CLINICAL DATA: Head trauma

EXAM:
CT HEAD WITHOUT CONTRAST
CT CERVICAL SPINE WITHOUT CONTRAST
TECHNIQUE: Multidetector CT imaging of the head and cervical spine was
performed following the standard protocol without intravenous
contrast. Multiplanar CT image reconstructions of the cervical spine
were also generated.

[Series 4: head w o · axial · 0.41mm/px · z∈[+201,+346]mm · 3 of 30 slices shown, 4 images]
[im 1/30  brain]
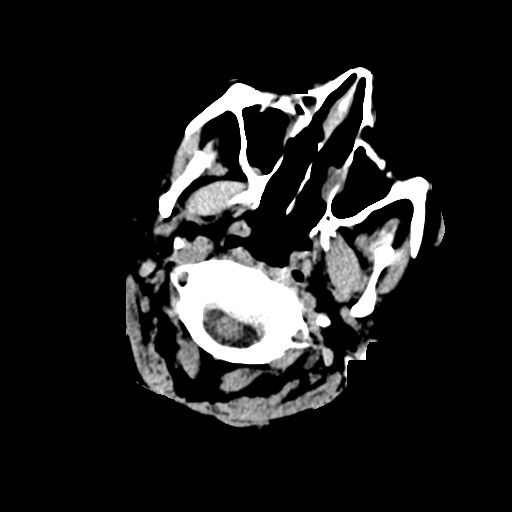
[im 1/30  bone]
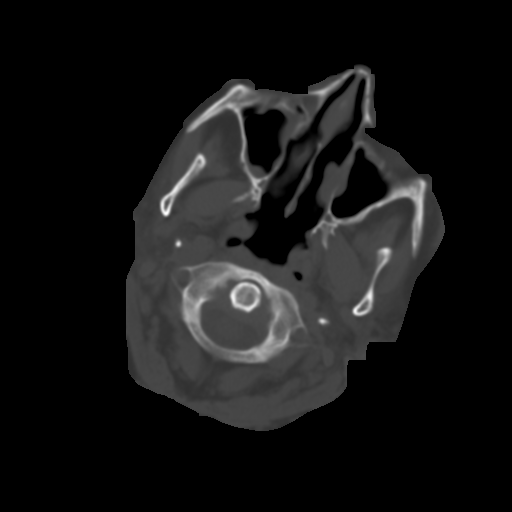
[im 15/30  brain]
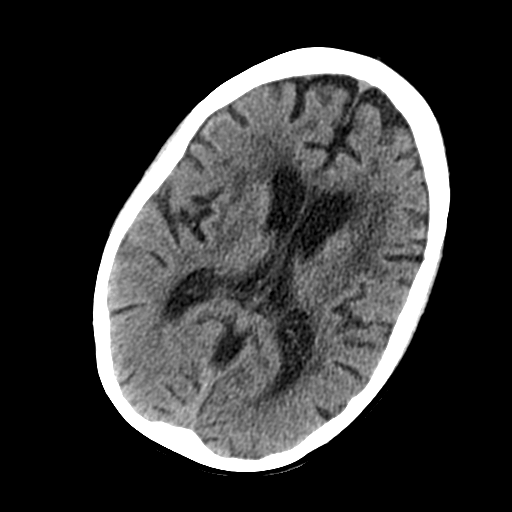
[im 30/30  brain]
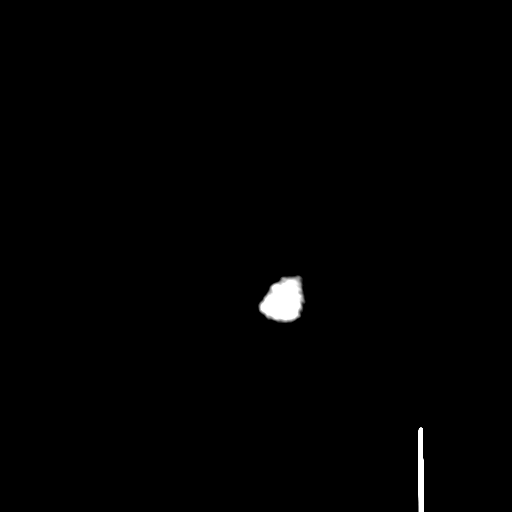

[Series 6: coronal soft · coronal · 0.36mm/px · 2 of 79 slices shown]
[im 27/79  brain]
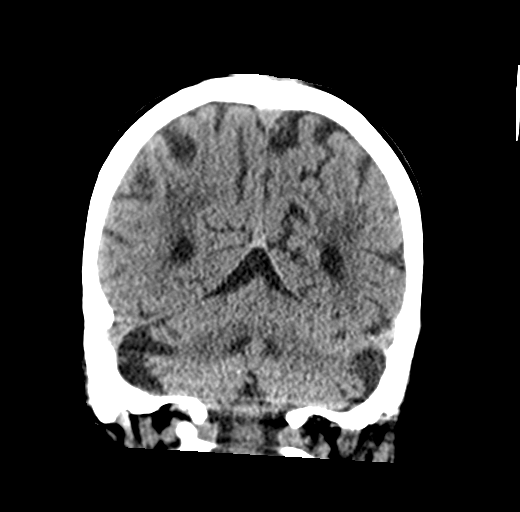
[im 53/79  brain]
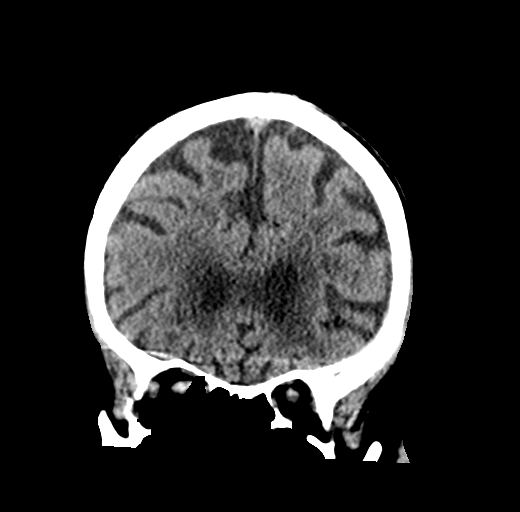

[Series 7: sagittal soft · sagittal · 0.34mm/px · 1 of 56 slices shown]
[im 28/56  brain]
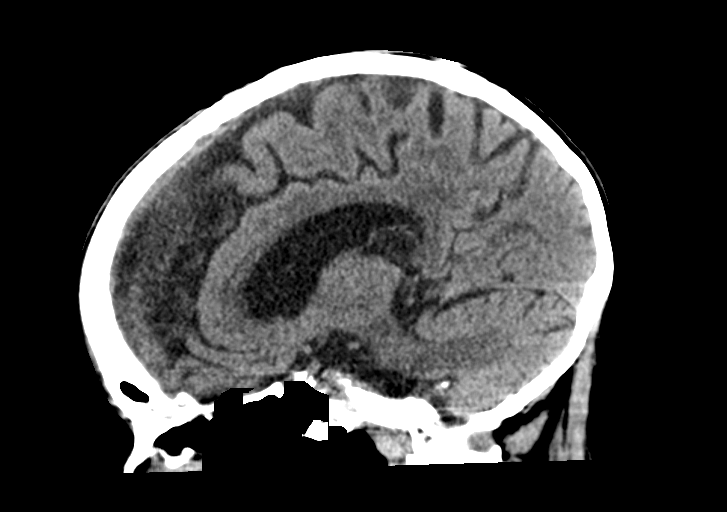

[Series 9: c spine soft · axial · 0.43mm/px · z∈[+81,+181]mm · 6 of 92 slices shown]
[im 11/92  brain]
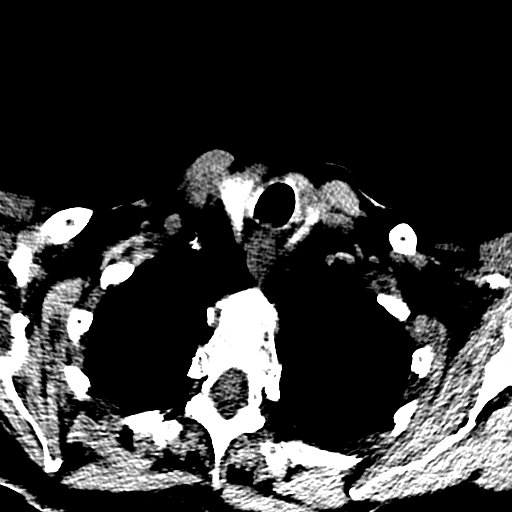
[im 21/92  brain]
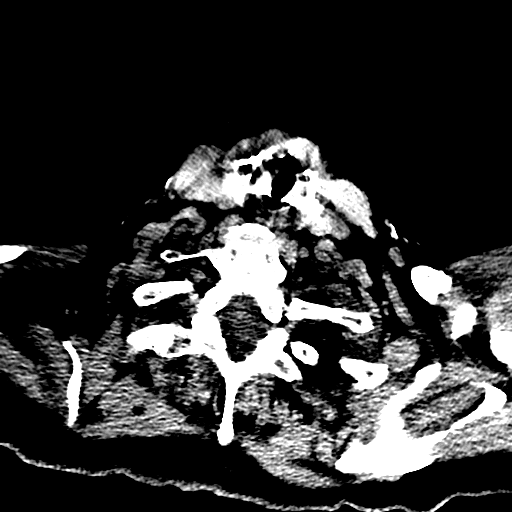
[im 31/92  brain]
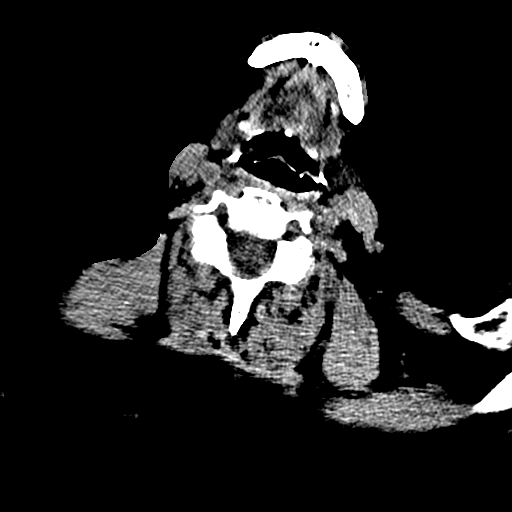
[im 41/92  brain]
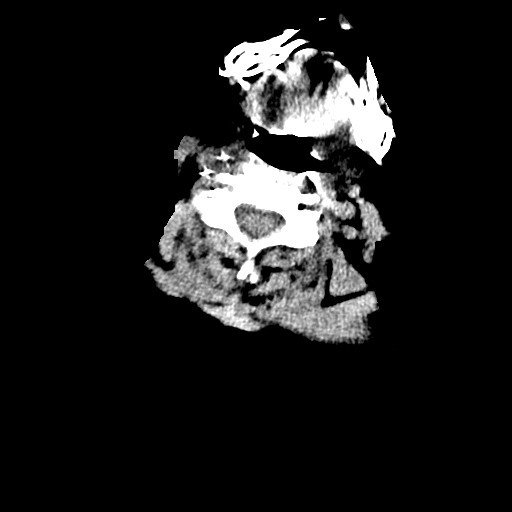
[im 51/92  brain]
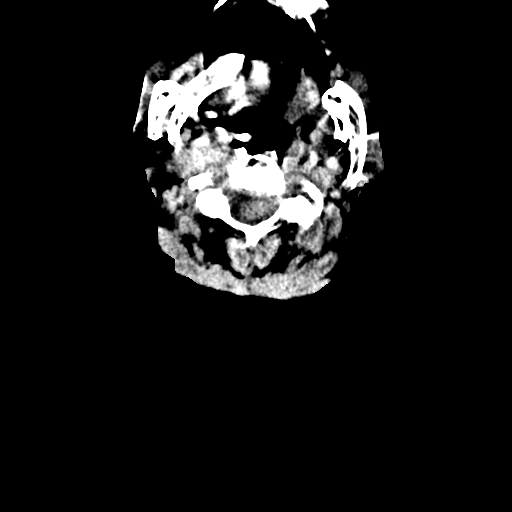
[im 61/92  brain]
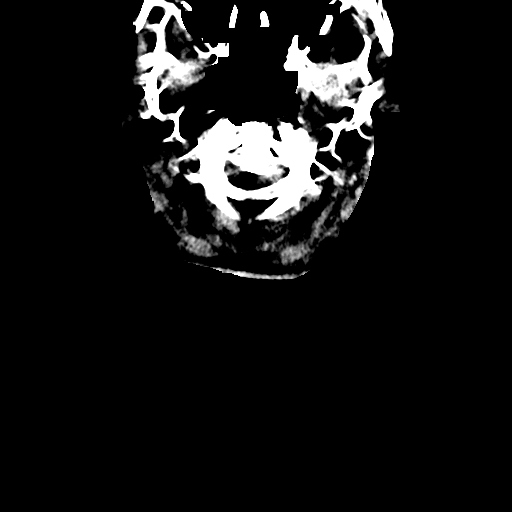

[Series 17: orthogonal axials · axial · 0.21mm/px · z∈[+85,+179]mm · 6 of 69 slices shown]
[im 10/69  brain]
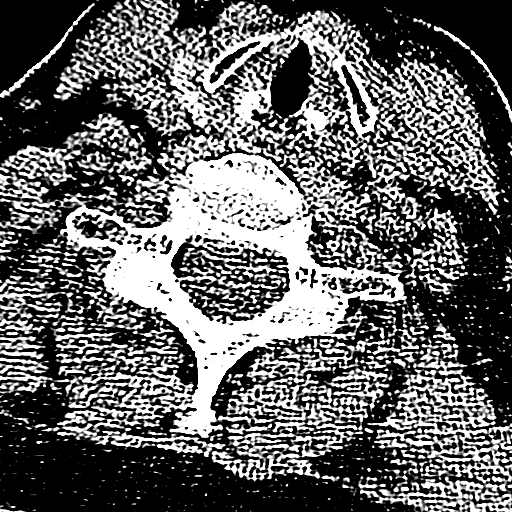
[im 20/69  brain]
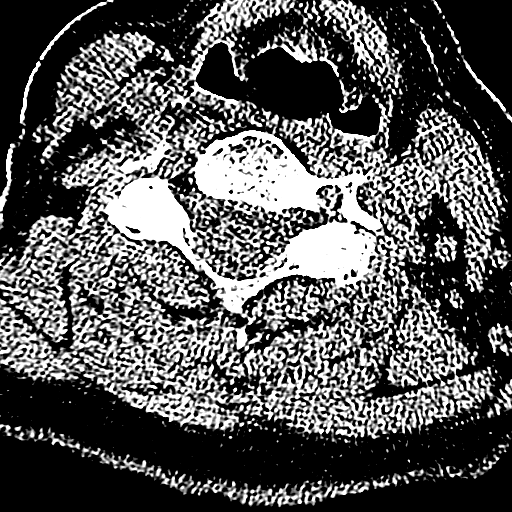
[im 30/69  brain]
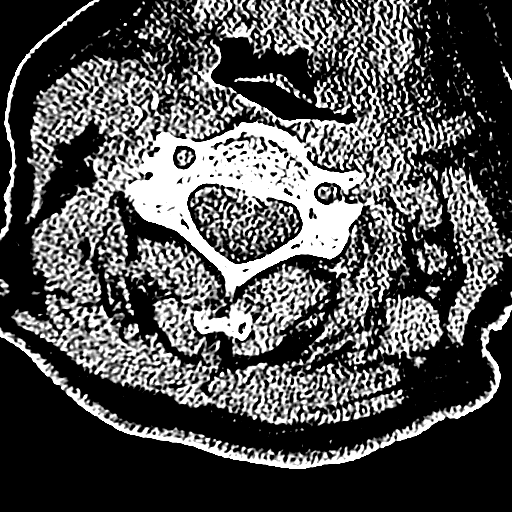
[im 39/69  brain]
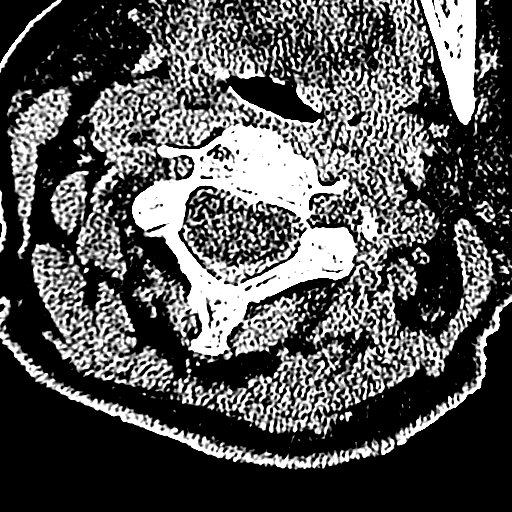
[im 49/69  brain]
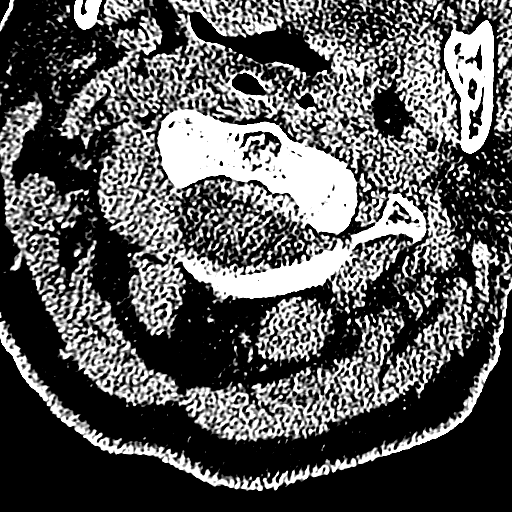
[im 59/69  brain]
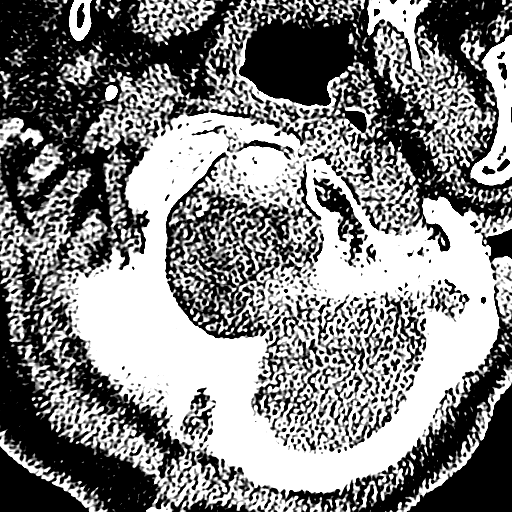

[18 of 47 positions shown; findings below may reference images not displayed]

FINDINGS: CT HEAD FINDINGS

Brain: No evidence of acute infarction, hemorrhage, hydrocephalus,
extra-axial collection or mass lesion/mass effect. Patchy
low-density in the cerebral white matter from chronic small vessel
ischemia, moderate to extensive. Nonspecific atrophy. Small
dural-based calcification along the right frontal convexity is
stable from 5425 and incidental.

Vascular: No hyperdense vessel or unexpected calcification.

Skull: Negative for fracture

Sinuses/Orbits: Bilateral mastoidectomy with opacification of the
right more than left mastoid bowl. Deficient bilateral ossicular
chain, worse on the right, likely stable.

CT CERVICAL SPINE FINDINGS

Alignment: Normal.

Skull base and vertebrae: No acute fracture. No primary bone lesion
or focal pathologic process.

Soft tissues and spinal canal: No prevertebral fluid or swelling. No
visible canal hematoma.

Disc levels:  Ordinary degenerative changes

Upper chest: Negative
IMPRESSION: 1. No evidence of intracranial or cervical spine injury.
2. Chronic findings are described above.

## 2020-07-25 IMAGING — DX DG SHOULDER 2+V*L*
2 series · 2 of 2 positions shown · non-contrast
Comparison: None.

CLINICAL DATA: Left shoulder pain

EXAM:
LEFT SHOULDER - 2+ VIEW

[shoulder grashey]
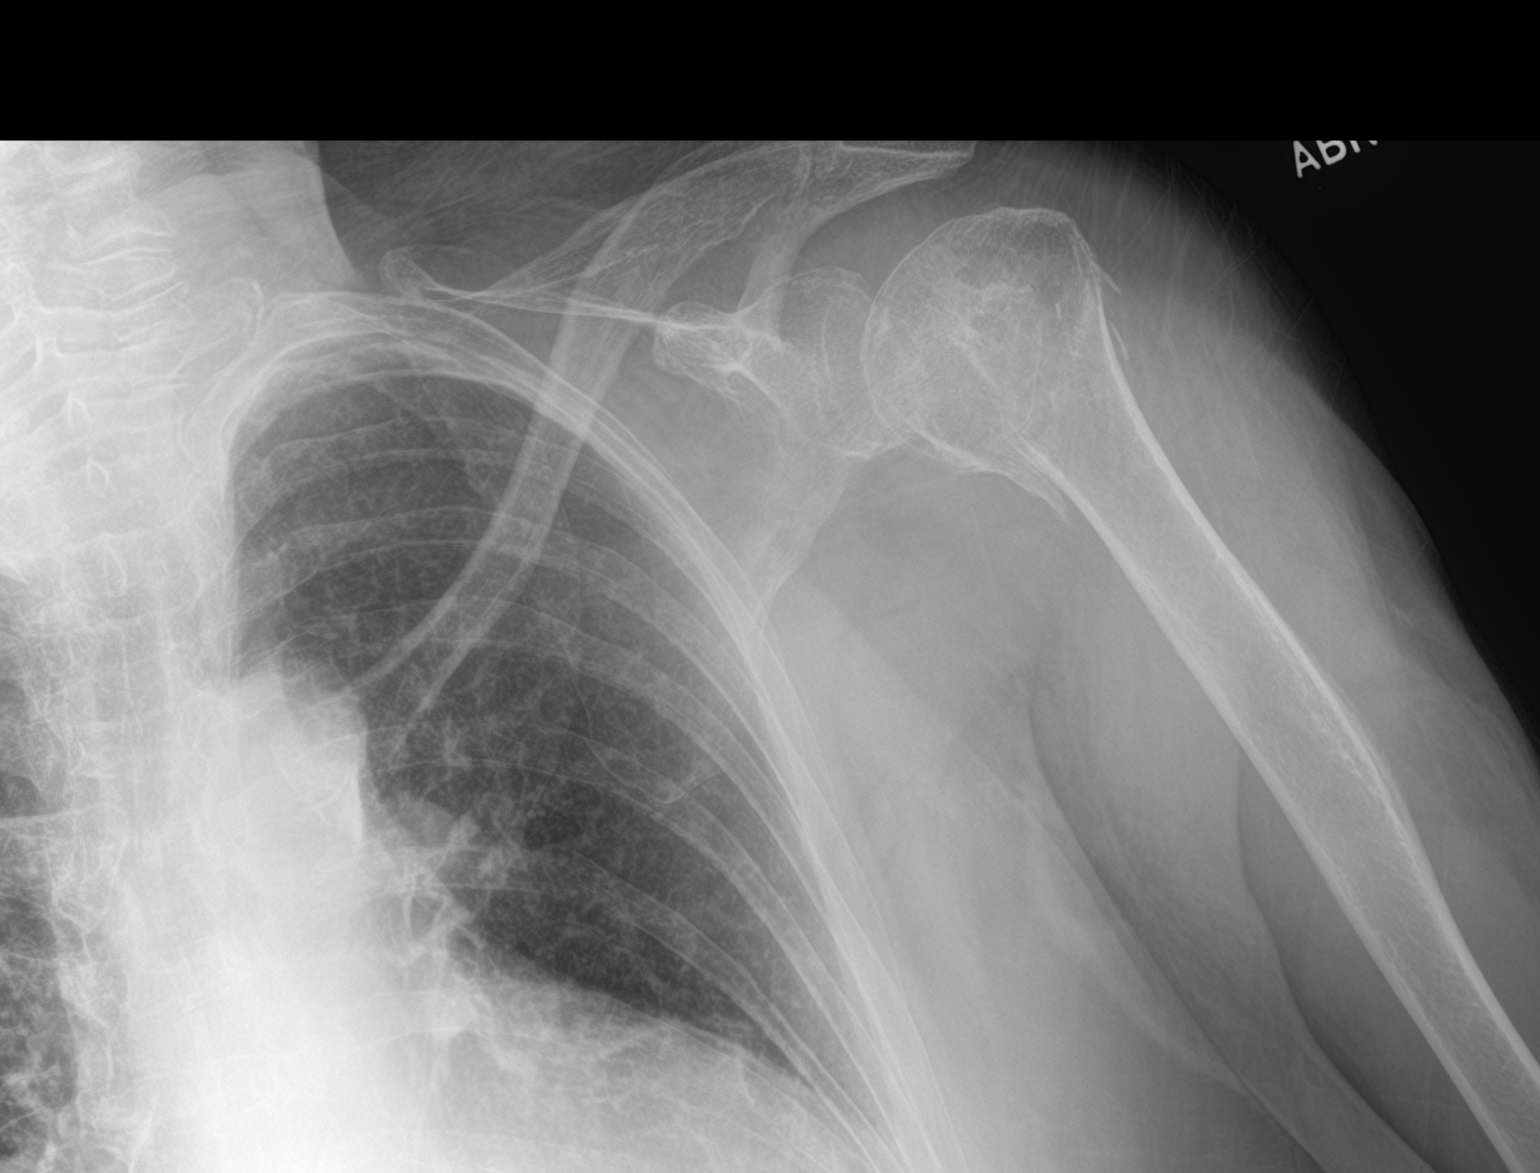

[shoulder y view]
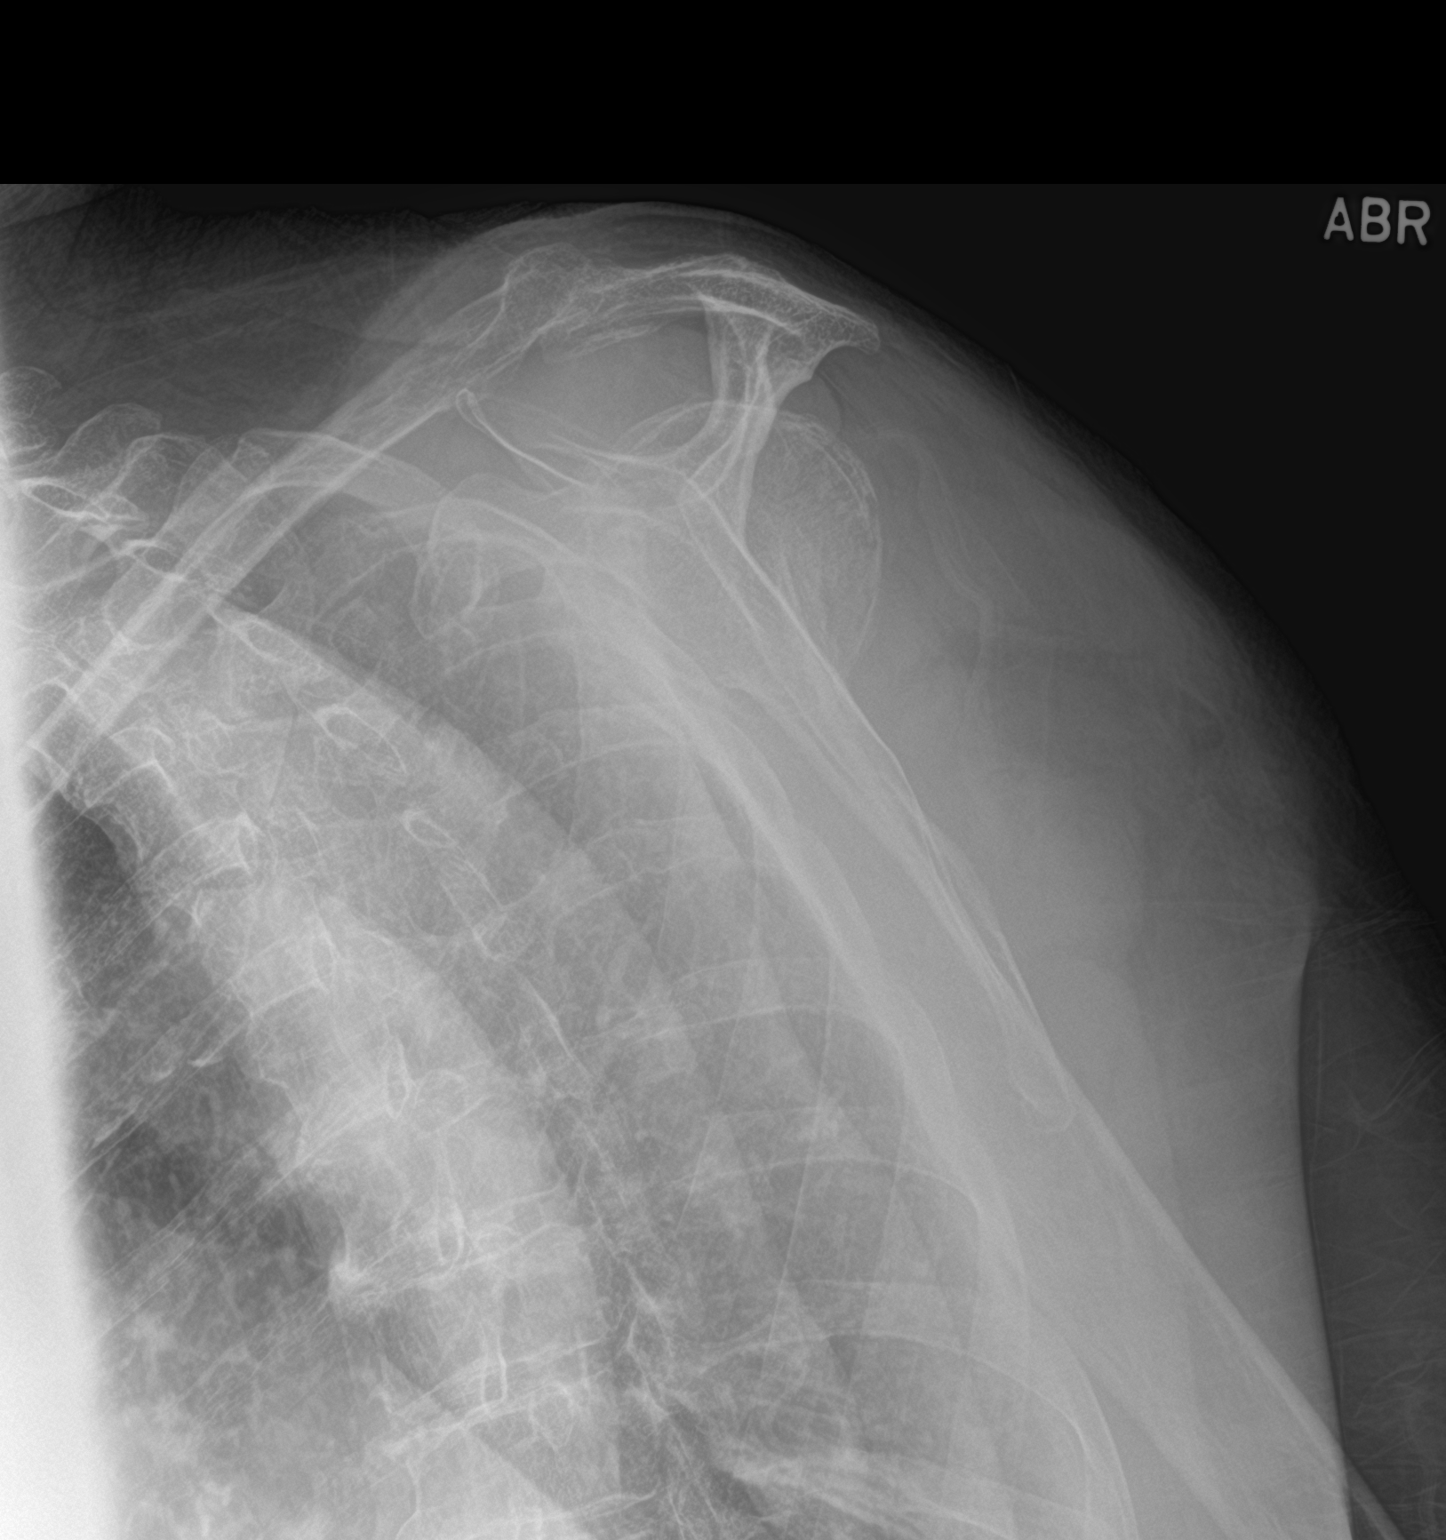

[2 of 2 positions shown; findings below may reference images not displayed]

FINDINGS: Nondisplaced surgical neck fracture of the humerus with mild
impaction medially and posteriorly. No dislocation. Prominent
generalized osteopenia.
IMPRESSION: Mildly impacted surgical neck fracture of the humerus.
